# Patient Record
Sex: Female | Born: 1970 | Race: White | Hispanic: No | Marital: Married | Smoking: Never smoker
Health system: Southern US, Community
[De-identification: ages and names within clinical notes are randomized; demographics above are authoritative.]

---

## 2010-07-14 ENCOUNTER — Encounter
Admission: RE | Admit: 2010-07-14 | Discharge: 2010-07-14 | Payer: Self-pay | Source: Home / Self Care | Attending: Obstetrics and Gynecology | Admitting: Obstetrics and Gynecology

## 2016-02-14 ENCOUNTER — Encounter (HOSPITAL_COMMUNITY): Payer: Self-pay

## 2016-02-14 ENCOUNTER — Emergency Department (HOSPITAL_COMMUNITY): Payer: BLUE CROSS/BLUE SHIELD

## 2016-02-14 ENCOUNTER — Emergency Department (HOSPITAL_COMMUNITY)
Admission: EM | Admit: 2016-02-14 | Discharge: 2016-02-14 | Disposition: A | Discharge status: Home / Self Care | Payer: BLUE CROSS/BLUE SHIELD | Attending: Emergency Medicine | Admitting: Emergency Medicine

## 2016-02-14 DIAGNOSIS — R1032 Left lower quadrant pain: Secondary | ICD-10-CM | POA: Diagnosis present

## 2016-02-14 LAB — COMPREHENSIVE METABOLIC PANEL
ALBUMIN: 4.1 g/dL (ref 3.5–5.0)
ALT: 13 U/L — ABNORMAL LOW (ref 14–54)
ANION GAP: 10 (ref 5–15)
AST: 16 U/L (ref 15–41)
Alkaline Phosphatase: 32 U/L — ABNORMAL LOW (ref 38–126)
BILIRUBIN TOTAL: 0.6 mg/dL (ref 0.3–1.2)
BUN: 21 mg/dL — ABNORMAL HIGH (ref 6–20)
CHLORIDE: 103 mmol/L (ref 101–111)
CO2: 24 mmol/L (ref 22–32)
Calcium: 9.3 mg/dL (ref 8.9–10.3)
Creatinine, Ser: 0.64 mg/dL (ref 0.44–1.00)
GFR calc Af Amer: >60 mL/min (ref >60)
GFR calc non Af Amer: >60 mL/min (ref >60)
GLUCOSE: 122 mg/dL — AB (ref 65–99)
POTASSIUM: 4 mmol/L (ref 3.5–5.1)
SODIUM: 137 mmol/L (ref 135–145)
TOTAL PROTEIN: 6.5 g/dL (ref 6.5–8.1)

## 2016-02-14 LAB — CBC WITH DIFFERENTIAL/PLATELET
BASOS PCT: 0 %
Basophils Absolute: 0 10*3/uL (ref 0.0–0.1)
EOS ABS: 0.3 10*3/uL (ref 0.0–0.7)
EOS PCT: 3 %
HCT: 39.8 % (ref 36.0–46.0)
Hemoglobin: 12.7 g/dL (ref 12.0–15.0)
Lymphocytes Relative: 54 %
Lymphs Abs: 4.2 10*3/uL — ABNORMAL HIGH (ref 0.7–4.0)
MCH: 29.5 pg (ref 26.0–34.0)
MCHC: 31.9 g/dL (ref 30.0–36.0)
MCV: 92.3 fL (ref 78.0–100.0)
MONO ABS: 0.5 10*3/uL (ref 0.1–1.0)
MONOS PCT: 6 %
Neutro Abs: 2.9 10*3/uL (ref 1.7–7.7)
Neutrophils Relative %: 37 %
PLATELETS: 292 10*3/uL (ref 150–400)
RBC: 4.31 MIL/uL (ref 3.87–5.11)
RDW: 13 % (ref 11.5–15.5)
WBC: 7.9 10*3/uL (ref 4.0–10.5)

## 2016-02-14 LAB — I-STAT BETA HCG BLOOD, ED (MC, WL, AP ONLY): I-stat hCG, quantitative: <5 m[IU]/mL (ref <5)

## 2016-02-14 LAB — LIPASE, BLOOD: Lipase: 28 U/L (ref 11–51)

## 2016-02-14 MED ORDER — SODIUM CHLORIDE 0.9 % IV BOLUS (SEPSIS)
1000.0000 mL | Freq: Once | INTRAVENOUS | Status: AC
  Administered 2016-02-14: 1000 mL via INTRAVENOUS

## 2016-02-14 MED ORDER — DIATRIZOATE MEGLUMINE & SODIUM 66-10 % PO SOLN
ORAL | Status: AC
  Filled 2016-02-14: qty 30

## 2016-02-14 MED ORDER — ONDANSETRON 4 MG PO TBDP: 4.0000 mg | ORAL_TABLET | Freq: Three times a day (TID) | ORAL | 1 refills | Status: AC | PRN

## 2016-02-14 MED ORDER — MORPHINE SULFATE (PF) 4 MG/ML IV SOLN
4.0000 mg | Freq: Once | INTRAVENOUS | Status: AC
  Administered 2016-02-14: 4 mg via INTRAVENOUS
  Filled 2016-02-14: qty 1

## 2016-02-14 MED ORDER — SODIUM CHLORIDE 0.9 % IV SOLN
INTRAVENOUS | Status: DC
  Administered 2016-02-14: 09:00:00 via INTRAVENOUS

## 2016-02-14 MED ORDER — ONDANSETRON HCL 4 MG/2ML IJ SOLN
4.0000 mg | Freq: Once | INTRAMUSCULAR | Status: AC
  Administered 2016-02-14: 4 mg via INTRAVENOUS
  Filled 2016-02-14: qty 2

## 2016-02-14 MED ORDER — IOPAMIDOL (ISOVUE-300) INJECTION 61%
INTRAVENOUS | Status: AC
  Administered 2016-02-14: 100 mL
  Filled 2016-02-14: qty 100

## 2016-02-14 MED ORDER — HYDROCODONE-ACETAMINOPHEN 5-325 MG PO TABS
1.0000 | ORAL_TABLET | Freq: Four times a day (QID) | ORAL | 0 refills | Status: AC | PRN
Start: 2016-02-14 — End: ?

## 2016-02-14 NOTE — Discharge Instructions (Signed)
As we discussed suspect that there may have been a passed kidney stone. Due to the severity of the pain that is now resolved. If the pain reoccurs return for evaluation for possible recurrent kidney stone. Also if there is any persistent symptoms in the pelvic area would recommend follow-up with your OB/GYN. 2 prescriptions provided hydrocodone as needed for any recurrent pain and Zofran as needed for any recurrent nausea or vomiting.

## 2016-02-14 NOTE — ED Notes (Signed)
TJ (husband) [phone]

## 2016-02-14 NOTE — ED Provider Notes (Signed)
[facility]-EMERGENCY DEPT Provider Note   CSN: [id number] Arrival date & time: 02/14/16  4098  First Provider Contact:  First MD Initiated Contact with Patient 02/14/16 [phone]        History   Chief Complaint Chief Complaint  Patient presents with  . Abdominal Pain    HPI Rose Hampton is a 45 y.o. female.  Patient with acute onset of left lower quadrant abdominal pain at about 5:30 this morning that quickly spread to the right lower quadrant. Associated with nausea and vomiting. No dysuria no fevers no diarrhea no history of any similar pain in the past. Pain was 8 out of 10. Pain did radiate to the back. Not associated with dysuria, no vaginal bleeding.      History reviewed. No pertinent past medical history.  There are no active problems to display for this patient.   History reviewed. No pertinent surgical history.  OB History    No data available       Home Medications    Prior to Admission medications   Medication Sig Start Date End Date Taking? Authorizing Provider  cholecalciferol (VITAMIN D) 1000 units tablet Take 4,000 Units by mouth daily.   Yes Historical Provider, MD  magnesium oxide (MAG-OX) 400 (241.3 Mg) MG tablet Take 400 mg by mouth daily.   Yes Historical Provider, MD  omega-3 acid ethyl esters (LOVAZA) 1 g capsule Take 1 g by mouth daily.   Yes Historical Provider, MD  Probiotic Product (PROBIOTIC PO) Take 1 tablet by mouth daily.   Yes Historical Provider, MD  vitamin C (ASCORBIC ACID) 500 MG tablet Take 250-500 mg by mouth daily.   Yes Historical Provider, MD  Zinc 30 MG CAPS Take 30 mg by mouth daily.   Yes Historical Provider, MD  HYDROcodone-acetaminophen (NORCO/VICODIN) 5-325 MG tablet Take 1-2 tablets by mouth every 6 (six) hours as needed for moderate pain. 02/14/16   Vanetta Mulders, MD  ondansetron (ZOFRAN ODT) 4 MG disintegrating tablet Take 1 tablet (4 mg total) by mouth every 8 (eight) hours as needed for nausea or vomiting. 02/14/16   Vanetta Mulders, MD    Family History No family history on file.  Social History Social History  Substance Use Topics  . Smoking status: Never Smoker  . Smokeless tobacco: Not on file  . Alcohol use Yes     Allergies   Sulfa antibiotics   Review of Systems Review of Systems  Constitutional: Negative for fever.  HENT: Negative for congestion.   Eyes: Negative for redness.  Respiratory: Negative for shortness of breath.   Cardiovascular: Negative for chest pain.  Gastrointestinal: Positive for abdominal pain, nausea and vomiting.  Genitourinary: Negative for dysuria, hematuria and vaginal bleeding.  Musculoskeletal: Positive for back pain.  Skin: Negative for rash.  Neurological: Negative for headaches.  Hematological: Does not bruise/bleed easily.  Psychiatric/Behavioral: Negative for confusion.     Physical Exam Updated Vital Signs BP 108/69 (BP Location: Left Arm)   Pulse 64   Temp 97.7 F (36.5 C) (Oral)   Resp 18   LMP 02/03/2016 (Approximate)   SpO2 100%   Physical Exam  Constitutional: She is oriented to person, place, and time. She appears well-developed and well-nourished. No distress.  HENT:  Head: Normocephalic and atraumatic.  Eyes: EOM are normal. Pupils are equal, round, and reactive to light.  Neck: Normal range of motion. Neck supple.  Cardiovascular: Normal rate, regular rhythm and normal heart sounds.   Pulmonary/Chest: Effort normal and breath sounds  normal.  Abdominal: Soft. Bowel sounds are normal. She exhibits no distension. There is no tenderness.  Neurological: She is alert and oriented to person, place, and time. No cranial nerve deficit. She exhibits normal muscle tone. Coordination normal.  Skin: Skin is warm and dry.  Nursing note and vitals reviewed.    ED Treatments / Results  Labs (all labs ordered are listed, but only abnormal results are displayed) Labs Reviewed  CBC WITH DIFFERENTIAL/PLATELET - Abnormal; Notable for the  following:       Result Value   Lymphs Abs 4.2 (*)    All other components within normal limits  COMPREHENSIVE METABOLIC PANEL - Abnormal; Notable for the following:    Glucose, Bld 122 (*)    BUN 21 (*)    ALT 13 (*)    Alkaline Phosphatase 32 (*)    All other components within normal limits  LIPASE, BLOOD  I-STAT BETA HCG BLOOD, ED ([facility], [facility], [facility] ONLY)   Results for orders placed or performed during the hospital encounter of 02/14/16  CBC with Differential/Platelet  Result Value Ref Range   WBC 7.9 4.0 - 10.5 K/uL   RBC 4.31 3.87 - 5.11 MIL/uL   Hemoglobin 12.7 12.0 - 15.0 g/dL   HCT 96.2 22.9 - 79.8 %   MCV 92.3 78.0 - 100.0 fL   MCH 29.5 26.0 - 34.0 pg   MCHC 31.9 30.0 - 36.0 g/dL   RDW 92.1 19.4 - 17.4 %   Platelets 292 150 - 400 K/uL   Neutrophils Relative % 37 %   Neutro Abs 2.9 1.7 - 7.7 K/uL   Lymphocytes Relative 54 %   Lymphs Abs 4.2 (H) 0.7 - 4.0 K/uL   Monocytes Relative 6 %   Monocytes Absolute 0.5 0.1 - 1.0 K/uL   Eosinophils Relative 3 %   Eosinophils Absolute 0.3 0.0 - 0.7 K/uL   Basophils Relative 0 %   Basophils Absolute 0.0 0.0 - 0.1 K/uL  Comprehensive metabolic panel  Result Value Ref Range   Sodium 137 135 - 145 mmol/L   Potassium 4.0 3.5 - 5.1 mmol/L   Chloride 103 101 - 111 mmol/L   CO2 24 22 - 32 mmol/L   Glucose, Bld 122 (H) 65 - 99 mg/dL   BUN 21 (H) 6 - 20 mg/dL   Creatinine, Ser 0.81 0.44 - 1.00 mg/dL   Calcium 9.3 8.9 - 44.8 mg/dL   Total Protein 6.5 6.5 - 8.1 g/dL   Albumin 4.1 3.5 - 5.0 g/dL   AST 16 15 - 41 U/L   ALT 13 (L) 14 - 54 U/L   Alkaline Phosphatase 32 (L) 38 - 126 U/L   Total Bilirubin 0.6 0.3 - 1.2 mg/dL   GFR calc non Af Amer >60 >60 mL/min   GFR calc Af Amer >60 >60 mL/min   Anion gap 10 5 - 15  Lipase, blood  Result Value Ref Range   Lipase 28 11 - 51 U/L  I-Stat Beta hCG blood, ED ([facility], [facility], [facility] only)  Result Value Ref Range   I-stat hCG, quantitative <5.0 <5 mIU/mL   Comment 3             EKG  EKG  Interpretation None       Radiology Ct Abdomen Pelvis W Contrast  Result Date: 02/14/2016 CLINICAL DATA:  Left lower quadrant pain.  Vomiting. EXAM: CT ABDOMEN AND PELVIS WITH CONTRAST TECHNIQUE: Multidetector CT imaging of the abdomen and pelvis was performed using  the standard protocol following bolus administration of intravenous contrast. CONTRAST:  ISOVUE-300 IOPAMIDOL (ISOVUE-300) INJECTION 61% COMPARISON:  None. FINDINGS: Normal lung bases. No free air. A tiny amount of fluid is seen in the pelvis. No other free fluid. Periportal edema is identified diffusely. The liver is otherwise normal in appearance. The portal vein is normal. The gallbladder remains and is normal in appearance. The spleen, adrenal glands, and pancreas are within normal limits. The kidneys demonstrate no acute abnormalities. The right kidney is normal. There is a tiny 2 mm stone in the lower left kidney as seen on axial image 37. No hydronephrosis or perinephric stranding. No ureterectasis or ureteral stones. The abdominal aorta is normal in appearance. No adenopathy. The stomach and small bowel are normal. The appendix is best seen on the coronal images and normal in appearance with no evidence of appendicitis. The colon is normal in appearance. The uterus and ovaries are normal in appearance. No adenopathy or mass in the pelvis. The bladder is normal. Delayed images through the upper abdomen demonstrate no filling defects in the upper renal collecting systems. No acute bony abnormalities. IMPRESSION: 1. No cause for acute pain identified. 2. Periportal edema. This is a nonspecific finding and is of uncertain etiology or acute significance. 3. The fluid in the pelvis is likely physiologic. 4. 2 mm nonobstructive stone in the lower left kidney. Electronically Signed   By: Gerome Sam III M.D   On: 02/14/2016 11:55    Procedures Procedures (including critical care time)  Medications Ordered in ED Medications  0.9 %   sodium chloride infusion ( Intravenous New Bag/Given 02/14/16 0910)  diatrizoate meglumine-sodium (GASTROGRAFIN) 66-10 % solution (not administered)  morphine 4 MG/ML injection 4 mg (4 mg Intravenous Given 02/14/16 0644)  ondansetron (ZOFRAN) injection 4 mg (4 mg Intravenous Given 02/14/16 0644)  sodium chloride 0.9 % bolus 1,000 mL (0 mLs Intravenous Stopped 02/14/16 0813)  iopamidol (ISOVUE-300) 61 % injection (100 mLs  Contrast Given 02/14/16 1056)     Initial Impression / Assessment and Plan / ED Course  I have reviewed the triage vital signs and the nursing notes.  Pertinent labs & imaging results that were available during my care of the patient were reviewed by me and considered in my medical decision making (see chart for details).  Clinical Course    Patient with extensive workup for acute onset of left lower quadrant abdominal pain that radiated to the right lower quadrant. CT scan shows evidence of a nonobstructing left-sided kidney stone but nothing in the ureter. Patient's pain went away somewhat spontaneously seemed to resolve with morphine but is remained away so I suspect that had resolved itself. CT scan otherwise without any acute findings. Patient's now asymptomatic. Suspect that she may have passed a kidney stone. That is not evident by CT scan. Patient without any renal function abnormalities no leukocytosis no anemia.  Patient will return for any recurrent similar symptoms that are severe. And will need further investigation of possible recurrent kidney stone.   If patient continues with any kind of persistent pelvic complaint without severe pain would recommend follow-up with her OB/GYN for further evaluation.  Final Clinical Impressions(s) / ED Diagnoses   Final diagnoses:  Left lower quadrant pain    New Prescriptions New Prescriptions   HYDROCODONE-ACETAMINOPHEN (NORCO/VICODIN) 5-325 MG TABLET    Take 1-2 tablets by mouth every 6 (six) hours as needed for moderate pain.    ONDANSETRON (ZOFRAN ODT) 4 MG DISINTEGRATING TABLET  Take 1 tablet (4 mg total) by mouth every 8 (eight) hours as needed for nausea or vomiting.     Vanetta Mulders, MD 02/14/16 1235

## 2016-02-14 NOTE — ED Triage Notes (Signed)
Pt c/o of stomach ache that started at 0530. Pain progressed quickly. Lower abdomen pain that radiates to hips. Episode of vomiting, denies diarrhea. Denies any urinary sx.

## 2018-12-26 DIAGNOSIS — Z6821 Body mass index (BMI) 21.0-21.9, adult: Secondary | ICD-10-CM | POA: Diagnosis not present

## 2018-12-26 DIAGNOSIS — Z01419 Encounter for gynecological examination (general) (routine) without abnormal findings: Secondary | ICD-10-CM | POA: Diagnosis not present

## 2019-03-15 DIAGNOSIS — Z86010 Personal history of colonic polyps: Secondary | ICD-10-CM | POA: Diagnosis not present

## 2019-03-15 DIAGNOSIS — Z1211 Encounter for screening for malignant neoplasm of colon: Secondary | ICD-10-CM | POA: Diagnosis not present

## 2019-04-27 DIAGNOSIS — Z86010 Personal history of colonic polyps: Secondary | ICD-10-CM | POA: Diagnosis not present

## 2019-04-27 DIAGNOSIS — Z1211 Encounter for screening for malignant neoplasm of colon: Secondary | ICD-10-CM | POA: Diagnosis not present

## 2019-09-21 ENCOUNTER — Other Ambulatory Visit: Payer: Self-pay | Admitting: Obstetrics and Gynecology

## 2019-09-21 DIAGNOSIS — Z1231 Encounter for screening mammogram for malignant neoplasm of breast: Secondary | ICD-10-CM

## 2019-10-12 ENCOUNTER — Ambulatory Visit: Payer: BLUE CROSS/BLUE SHIELD

## 2019-10-12 ENCOUNTER — Ambulatory Visit
Admission: RE | Admit: 2019-10-12 | Discharge: 2019-10-12 | Disposition: A | Discharge status: Home / Self Care | Payer: BC Managed Care – PPO | Source: Ambulatory Visit | Attending: Obstetrics and Gynecology | Admitting: Obstetrics and Gynecology

## 2019-10-12 ENCOUNTER — Other Ambulatory Visit: Payer: Self-pay

## 2019-10-12 DIAGNOSIS — Z1231 Encounter for screening mammogram for malignant neoplasm of breast: Secondary | ICD-10-CM

## 2019-10-29 DIAGNOSIS — Z03818 Encounter for observation for suspected exposure to other biological agents ruled out: Secondary | ICD-10-CM | POA: Diagnosis not present

## 2019-10-29 DIAGNOSIS — Z20828 Contact with and (suspected) exposure to other viral communicable diseases: Secondary | ICD-10-CM | POA: Diagnosis not present

## 2020-01-03 DIAGNOSIS — Z0189 Encounter for other specified special examinations: Secondary | ICD-10-CM | POA: Diagnosis not present

## 2020-01-03 DIAGNOSIS — Z01419 Encounter for gynecological examination (general) (routine) without abnormal findings: Secondary | ICD-10-CM | POA: Diagnosis not present

## 2020-01-03 DIAGNOSIS — Z6820 Body mass index (BMI) 20.0-20.9, adult: Secondary | ICD-10-CM | POA: Diagnosis not present

## 2020-01-03 DIAGNOSIS — Z124 Encounter for screening for malignant neoplasm of cervix: Secondary | ICD-10-CM | POA: Diagnosis not present

## 2020-03-05 DIAGNOSIS — U071 COVID-19: Secondary | ICD-10-CM | POA: Diagnosis not present

## 2020-04-07 DIAGNOSIS — H524 Presbyopia: Secondary | ICD-10-CM | POA: Diagnosis not present

## 2020-04-07 DIAGNOSIS — H5203 Hypermetropia, bilateral: Secondary | ICD-10-CM | POA: Diagnosis not present

## 2020-04-07 DIAGNOSIS — H53022 Refractive amblyopia, left eye: Secondary | ICD-10-CM | POA: Diagnosis not present

## 2020-04-07 DIAGNOSIS — H52223 Regular astigmatism, bilateral: Secondary | ICD-10-CM | POA: Diagnosis not present

## 2020-05-19 DIAGNOSIS — R0602 Shortness of breath: Secondary | ICD-10-CM | POA: Diagnosis not present

## 2020-05-19 DIAGNOSIS — U099 Post covid-19 condition, unspecified: Secondary | ICD-10-CM | POA: Diagnosis not present

## 2020-05-20 ENCOUNTER — Ambulatory Visit
Admission: RE | Admit: 2020-05-20 | Discharge: 2020-05-20 | Disposition: A | Discharge status: Home / Self Care | Payer: BC Managed Care – PPO | Source: Ambulatory Visit | Attending: Internal Medicine | Admitting: Internal Medicine

## 2020-05-20 ENCOUNTER — Other Ambulatory Visit: Payer: Self-pay | Admitting: Internal Medicine

## 2020-05-20 DIAGNOSIS — R0602 Shortness of breath: Secondary | ICD-10-CM | POA: Diagnosis not present

## 2020-05-27 DIAGNOSIS — U099 Post covid-19 condition, unspecified: Secondary | ICD-10-CM | POA: Diagnosis not present

## 2020-05-27 DIAGNOSIS — G933 Postviral fatigue syndrome: Secondary | ICD-10-CM | POA: Diagnosis not present

## 2020-06-27 DIAGNOSIS — L658 Other specified nonscarring hair loss: Secondary | ICD-10-CM | POA: Diagnosis not present

## 2020-06-27 DIAGNOSIS — L219 Seborrheic dermatitis, unspecified: Secondary | ICD-10-CM | POA: Diagnosis not present

## 2020-06-27 DIAGNOSIS — L650 Telogen effluvium: Secondary | ICD-10-CM | POA: Diagnosis not present

## 2020-10-22 DIAGNOSIS — Z0000 Encounter for general adult medical examination without abnormal findings: Secondary | ICD-10-CM | POA: Diagnosis not present

## 2020-10-22 DIAGNOSIS — K644 Residual hemorrhoidal skin tags: Secondary | ICD-10-CM | POA: Diagnosis not present

## 2020-10-22 DIAGNOSIS — Z13220 Encounter for screening for lipoid disorders: Secondary | ICD-10-CM | POA: Diagnosis not present

## 2020-10-22 DIAGNOSIS — Z130 Encounter for screening for diseases of the blood and blood-forming organs and certain disorders involving the immune mechanism: Secondary | ICD-10-CM | POA: Diagnosis not present

## 2020-11-05 DIAGNOSIS — K644 Residual hemorrhoidal skin tags: Secondary | ICD-10-CM | POA: Diagnosis not present

## 2020-12-11 DIAGNOSIS — K644 Residual hemorrhoidal skin tags: Secondary | ICD-10-CM | POA: Diagnosis not present

## 2021-01-15 DIAGNOSIS — K644 Residual hemorrhoidal skin tags: Secondary | ICD-10-CM | POA: Diagnosis not present

## 2021-12-06 IMAGING — MG DIGITAL SCREENING BILAT W/ CAD
4 series · 4 of 4 positions shown · non-contrast
Comparison: Previous exam(s).

CLINICAL DATA: Screening.

EXAM:
DIGITAL SCREENING BILATERAL MAMMOGRAM WITH CAD

[R CC]
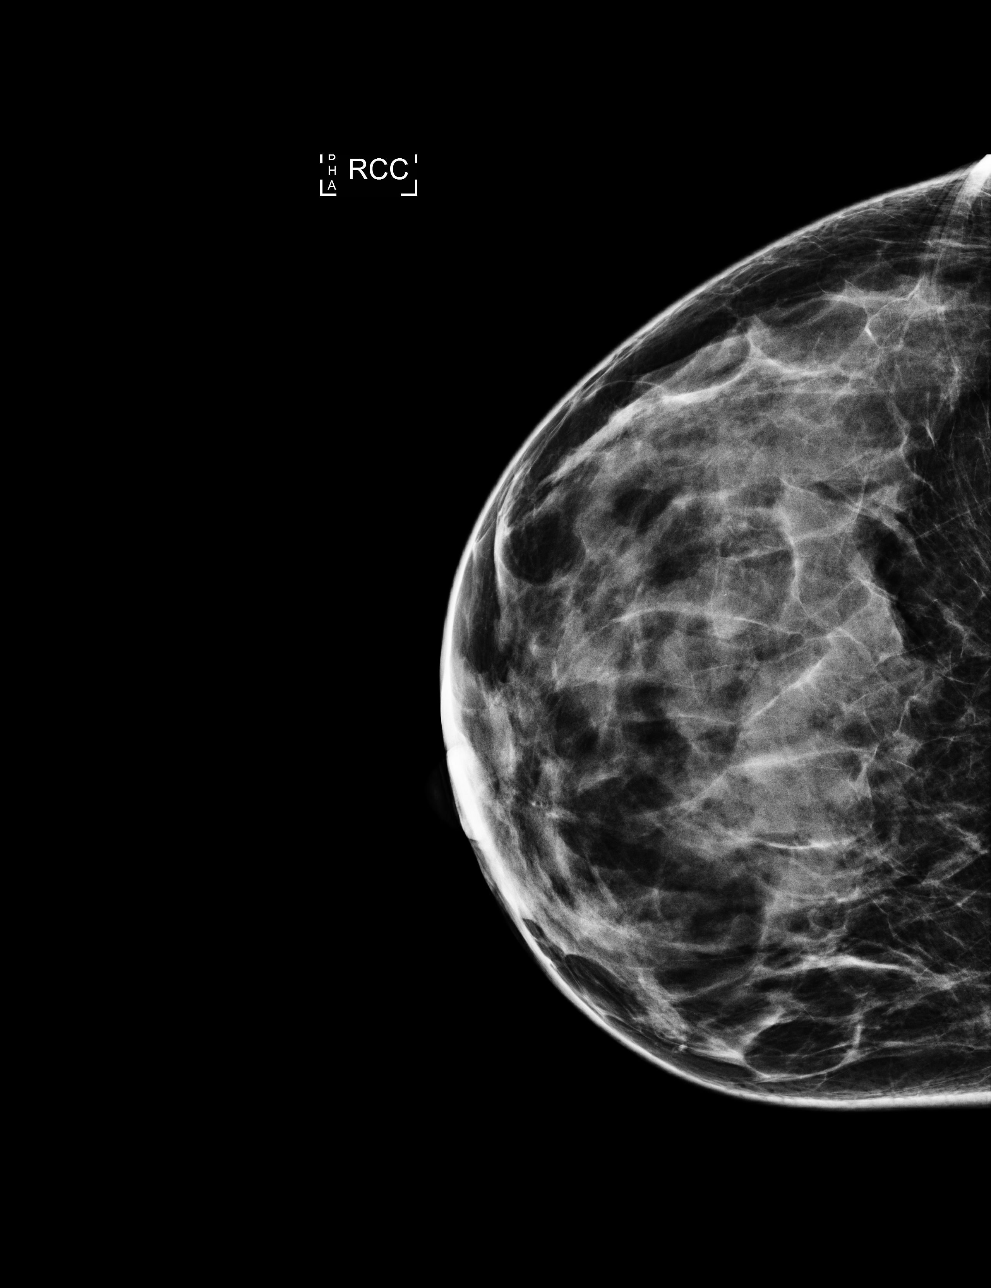

[L CC]
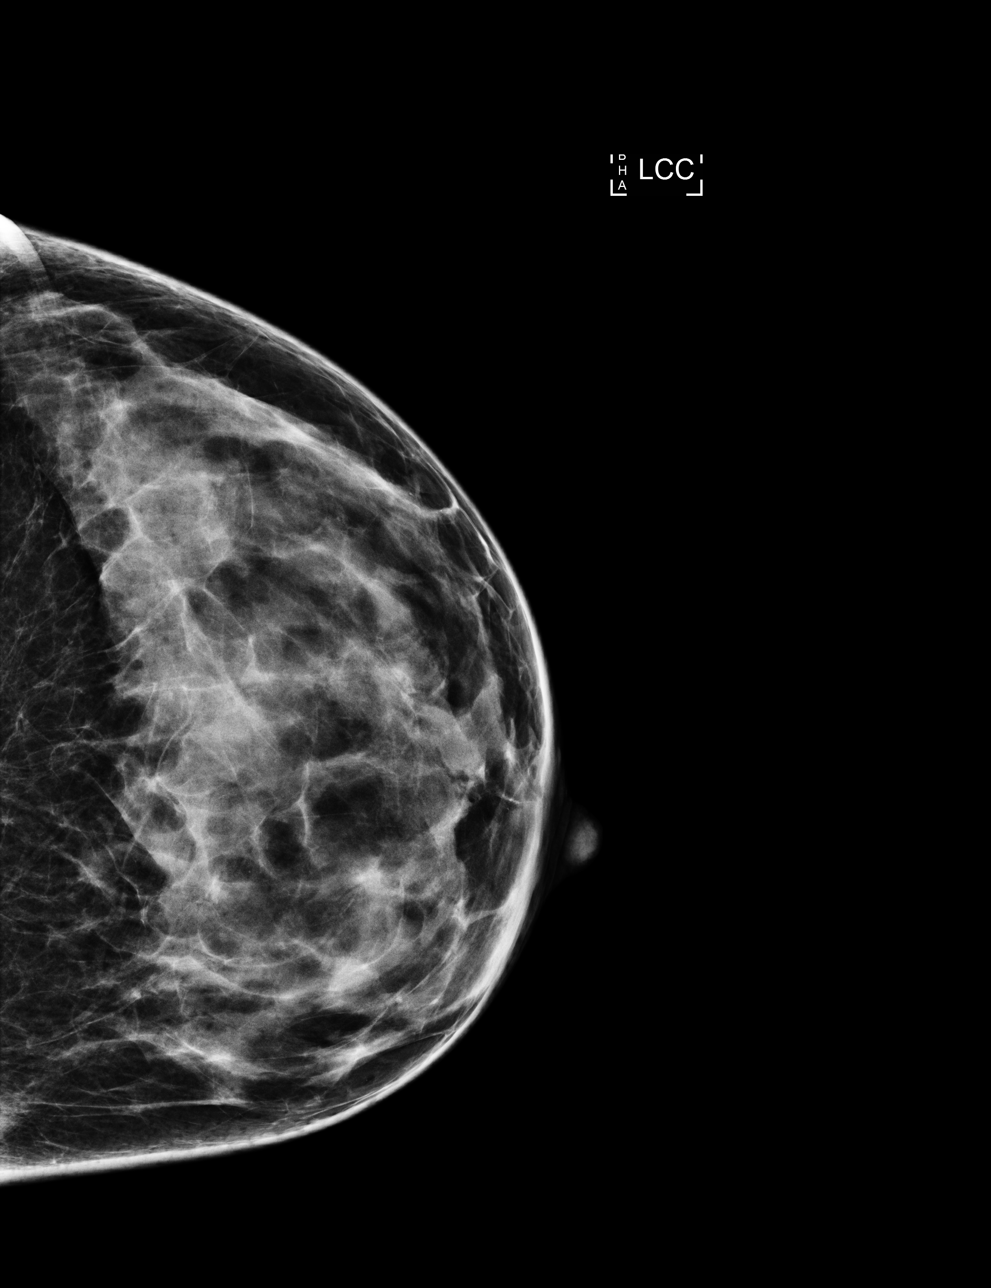

[L MLO]
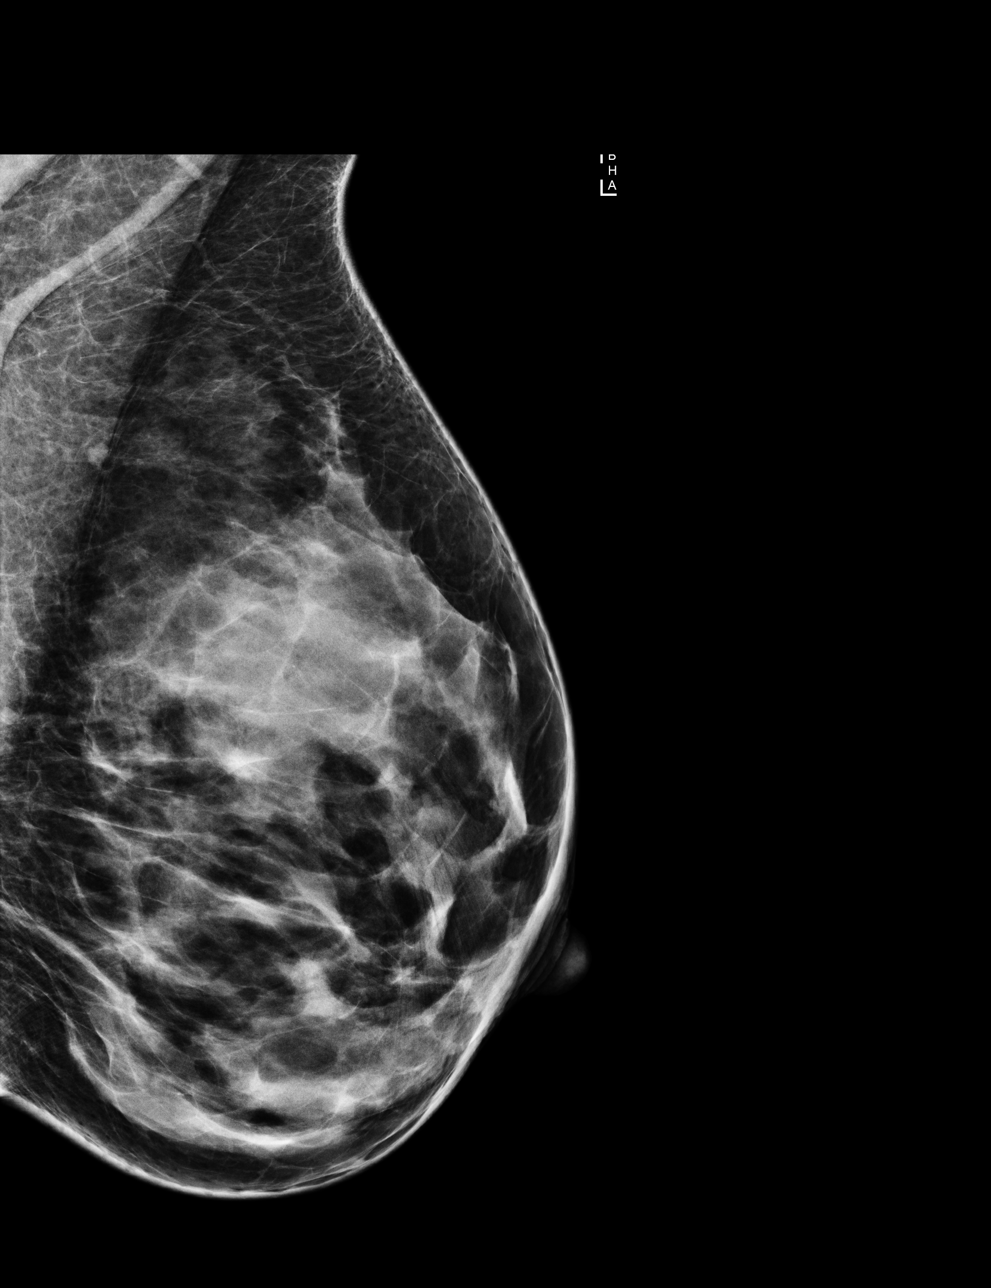

[R MLO]
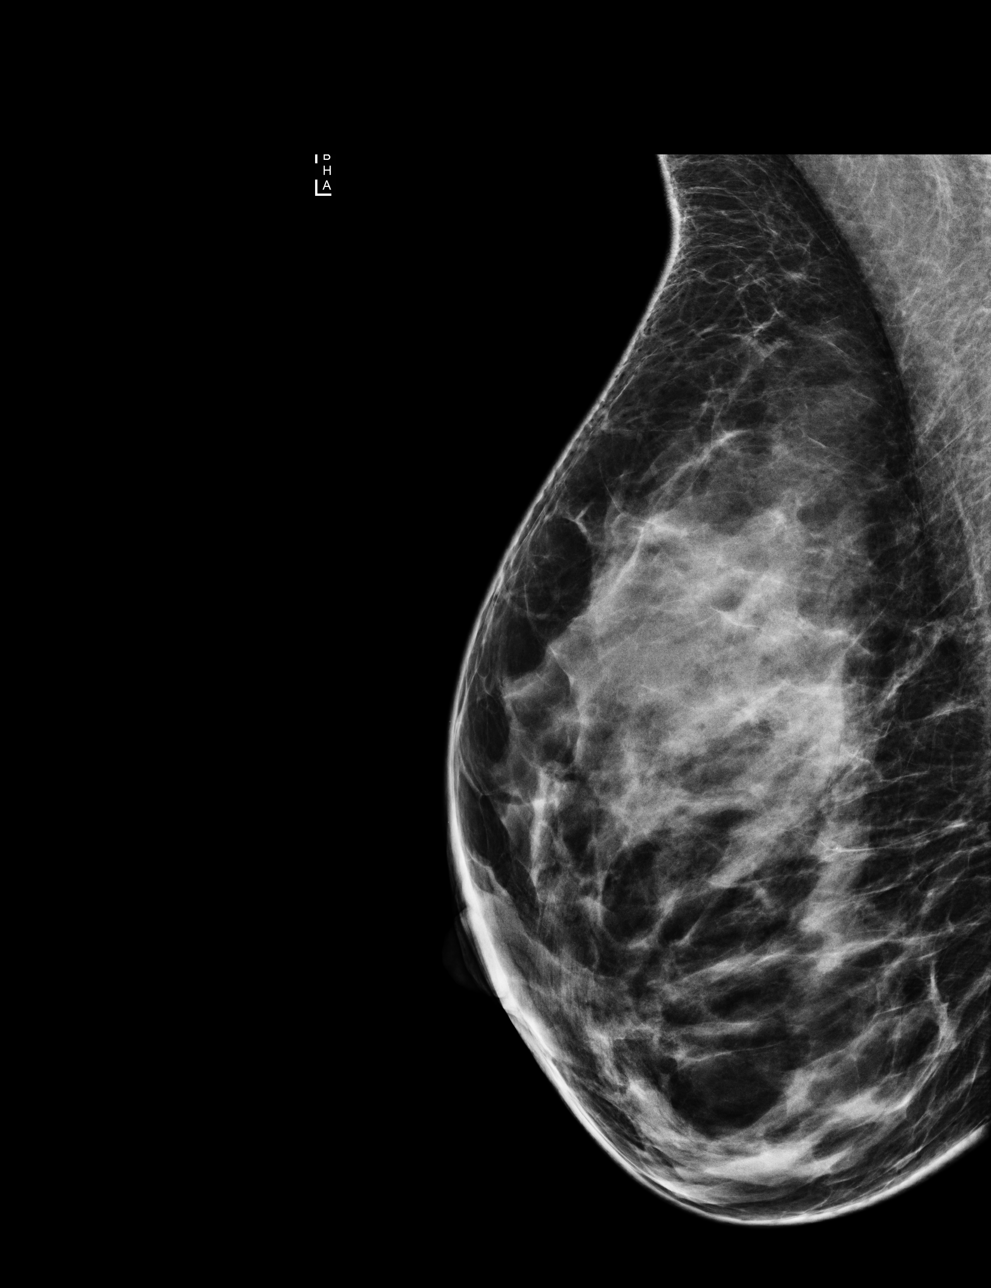

[4 of 4 positions shown; findings below may reference images not displayed]

ACR Breast Density Category c: The breast tissue is heterogeneously
dense, which may obscure small masses.
FINDINGS: There are no findings suspicious for malignancy. Images were
processed with CAD.
IMPRESSION: No mammographic evidence of malignancy. A result letter of this
screening mammogram will be mailed directly to the patient.

RECOMMENDATION:
Screening mammogram in one year. (Code:[id number])

BI-RADS CATEGORY  1: Negative.

## 2022-07-09 ENCOUNTER — Emergency Department (HOSPITAL_COMMUNITY)
Admission: EM | Admit: 2022-07-09 | Discharge: 2022-07-10 | Disposition: A | Discharge status: Home / Self Care | Payer: BC Managed Care – PPO | Attending: Student | Admitting: Student

## 2022-07-09 ENCOUNTER — Encounter (HOSPITAL_COMMUNITY): Payer: Self-pay | Admitting: *Deleted

## 2022-07-09 ENCOUNTER — Other Ambulatory Visit: Payer: Self-pay

## 2022-07-09 DIAGNOSIS — N939 Abnormal uterine and vaginal bleeding, unspecified: Secondary | ICD-10-CM

## 2022-07-09 DIAGNOSIS — R102 Pelvic and perineal pain: Secondary | ICD-10-CM | POA: Insufficient documentation

## 2022-07-09 LAB — BASIC METABOLIC PANEL
Anion gap: 4 — ABNORMAL LOW (ref 5–15)
BUN: 6 mg/dL (ref 6–20)
CO2: 26 mmol/L (ref 22–32)
Calcium: 9.2 mg/dL (ref 8.9–10.3)
Chloride: 102 mmol/L (ref 98–111)
Creatinine, Ser: 0.53 mg/dL (ref 0.44–1.00)
GFR, Estimated: >60 mL/min (ref >60)
Glucose, Bld: 105 mg/dL — ABNORMAL HIGH (ref 70–99)
Potassium: 3.7 mmol/L (ref 3.5–5.1)
Sodium: 132 mmol/L — ABNORMAL LOW (ref 135–145)

## 2022-07-09 LAB — CBC
HCT: 36.8 % (ref 36.0–46.0)
Hemoglobin: 12.1 g/dL (ref 12.0–15.0)
MCH: 29.8 pg (ref 26.0–34.0)
MCHC: 32.9 g/dL (ref 30.0–36.0)
MCV: 90.6 fL (ref 80.0–100.0)
Platelets: 279 10*3/uL (ref 150–400)
RBC: 4.06 MIL/uL (ref 3.87–5.11)
RDW: 12 % (ref 11.5–15.5)
WBC: 6 10*3/uL (ref 4.0–10.5)
nRBC: 0 % (ref 0.0–0.2)

## 2022-07-09 LAB — I-STAT BETA HCG BLOOD, ED (MC, WL, AP ONLY): I-stat hCG, quantitative: <5 m[IU]/mL (ref <5)

## 2022-07-09 NOTE — ED Provider Triage Note (Signed)
Emergency Medicine Provider Triage Evaluation Note  Rose Hampton , a 51 y.o. female  was evaluated in triage.  Pt complains of vaginal bleeding. She states that on November 16th she had her normal period that is typically very heavy for 1 day and then led by a few days of spotting and then subsides.  States that this time she continued to spot persistently until today when she had a large rush of blood that is continued throughout the day.  States that she is going through tampons every few minutes.  Denies any abdominal pain.  Denies any history of similar previously.  States she has been told she is perimenopausal.   Review of Systems  Positive:  Negative:   Physical Exam  BP (!) 159/93   Pulse 92   Temp 99.1 F (37.3 C) (Oral)   Resp 18   Ht 5\' 3"  (1.6 m)   Wt 54.4 kg   LMP 05/27/2022   SpO2 99%   BMI 21.26 kg/m  Gen:   Awake, no distress   Resp:  Normal effort  MSK:   Moves extremities without difficulty  Other:    Medical Decision Making  Medically screening exam initiated at 7:55 PM.  Appropriate orders placed.  Joe Tanney was informed that the remainder of the evaluation will be completed by another provider, this initial triage assessment does not replace that evaluation, and the importance of remaining in the ED until their evaluation is complete.     Margret Chance, PA-C 07/09/22 [phone]

## 2022-07-09 NOTE — ED Triage Notes (Signed)
The pt started her period on the 16th since then ahs has had heavy vaginal bleeding with clots worse today  lmp November 16th

## 2022-07-10 ENCOUNTER — Emergency Department (HOSPITAL_COMMUNITY): Payer: BC Managed Care – PPO

## 2022-07-10 LAB — HEMOGLOBIN AND HEMATOCRIT, BLOOD
HCT: 36.7 % (ref 36.0–46.0)
Hemoglobin: 11.9 g/dL — ABNORMAL LOW (ref 12.0–15.0)

## 2022-07-10 MED ORDER — MEDROXYPROGESTERONE ACETATE 5 MG PO TABS: 5.0000 mg | ORAL_TABLET | Freq: Every day | ORAL | 0 refills | Status: AC

## 2022-07-10 MED ORDER — SODIUM CHLORIDE 0.9 % IV BOLUS
1000.0000 mL | Freq: Once | INTRAVENOUS | Status: AC
  Administered 2022-07-10: 1000 mL via INTRAVENOUS

## 2022-07-10 MED ORDER — MEDROXYPROGESTERONE ACETATE 2.5 MG PO TABS
5.0000 mg | ORAL_TABLET | Freq: Every day | ORAL | Status: DC
  Administered 2022-07-10: 5 mg via ORAL
  Filled 2022-07-10: qty 2

## 2022-07-10 MED ORDER — NAPROXEN 250 MG PO TABS
500.0000 mg | ORAL_TABLET | Freq: Once | ORAL | Status: AC
  Administered 2022-07-10: 500 mg via ORAL
  Filled 2022-07-10: qty 2

## 2022-07-10 MED ORDER — MEDROXYPROGESTERONE ACETATE 2.5 MG PO TABS: 5.0000 mg | ORAL_TABLET | Freq: Every day | ORAL | Status: DC

## 2022-07-10 NOTE — ED Notes (Signed)
IV fluids complete sitting up on stretcher ginger ale and crackers given. States she is feeling better.

## 2022-07-10 NOTE — Discharge Instructions (Addendum)
FINDINGS:  Uterus    Measurements: 9.9 x 4.9 x 7.3 cm = volume: 186.8 mL. No fibroids or  other mass visualized. The cervix is not optimally visualized but it  appears to be closed.    Endometrium    Thickness: 15 mm, prominent for age and perimenopausal state. There  is a small amount of fluid or blood separating the endometrial  walls. No focal endometrial mass was seen.    Right ovary    Measurements: 3.6 x 2.1 x 1.5 cm = volume: 6.0 mL. Normal  appearance/no adnexal mass.    Left ovary    Measurements: 2.5 x 1.3 x 1.3 cm = volume: 2.1 mL. Normal  appearance/no adnexal mass.    Color doppler evaluation demonstrates normal appearing color flow.    Other: No free fluid is seen.  No adnexal mass is evident.    IMPRESSION:  1. The endometrium is prominent for age and perimenopausal state.  There is a small amount of fluid or blood separating the endometrial  walls.  2. The uterus is otherwise unremarkable.  3. The ovaries are normal in size. No adnexal mass, torsion or free  fluid is evident.  4. If bleeding remains unresponsive to hormonal or medical therapy,  sonohysterogram should be considered for focal lesion work-up. (Ref:  Radiological Reasoning: Algorithmic Workup of Abnormal Vaginal  Bleeding with Endovaginal Sonography and Sonohysterography. AJR  2008; 681:L57-26)  5. The cervix is not optimally visualized but appears to be closed.      Electronically Signed

## 2022-07-10 NOTE — ED Notes (Signed)
NT informed this RN that pt got nauseous and diaphoretic when getting up for d/c. Pt laid back in bed and VS obtained. Pt hypotensive at this time. EDP notified.

## 2022-07-10 NOTE — ED Notes (Signed)
States she is still feeling ok ,stilling on the side of the stretcher.

## 2022-07-10 NOTE — ED Provider Notes (Signed)
[facility] EMERGENCY DEPARTMENT Provider Note  CSN: [id number] Arrival date & time: 07/09/22 1932  Chief Complaint(s) Vaginal Bleeding  HPI Rose Hampton is a 51 y.o. female who presents emergency department for evaluation of vaginal bleeding.  Patient states that over the last 1 year she has had heavier periods but over the last 24 hours has had a significant increase in the amount of vaginal bleeding associated with her current period.  She states that she felt a large gush of blood and saw multiple blood clots.  She is using tampons and having to change them frequently.  She denies abdominal pain, nausea, vomiting, chest pain, shortness of breath or other systemic symptoms.  She is currently not on any birth control and denies blood thinner use.   Past Medical History History reviewed. No pertinent past medical history. There are no problems to display for this patient.  Home Medication(s) Prior to Admission medications   Medication Sig Start Date End Date Taking? Authorizing Provider  cholecalciferol (VITAMIN D) 1000 units tablet Take 4,000 Units by mouth daily.    [provider]  HYDROcodone-acetaminophen (NORCO/VICODIN) 5-325 MG tablet Take 1-2 tablets by mouth every 6 (six) hours as needed for moderate pain. 02/14/16   Vanetta Mulders, MD  magnesium oxide (MAG-OX) 400 (241.3 Mg) MG tablet Take 400 mg by mouth daily.    [provider]  omega-3 acid ethyl esters (LOVAZA) 1 g capsule Take 1 g by mouth daily.    [provider]  ondansetron (ZOFRAN ODT) 4 MG disintegrating tablet Take 1 tablet (4 mg total) by mouth every 8 (eight) hours as needed for nausea or vomiting. 02/14/16   Vanetta Mulders, MD  Probiotic Product (PROBIOTIC PO) Take 1 tablet by mouth daily.    [provider]  vitamin C (ASCORBIC ACID) 500 MG tablet Take 250-500 mg by mouth daily.    [provider]  Zinc 30 MG CAPS Take 30 mg by mouth daily.     [provider]                                                                                                                                    Past Surgical History History reviewed. No pertinent surgical history. Family History History reviewed. No pertinent family history.  Social History Social History   Tobacco Use   Smoking status: Never  Substance Use Topics   Alcohol use: Yes   Allergies Sulfa antibiotics  Review of Systems Review of Systems  Genitourinary:  Positive for vaginal bleeding.    Physical Exam Vital Signs  I have reviewed the triage vital signs BP (!) 152/86   Pulse 67   Temp 97.9 F (36.6 C)   Resp 16   Ht 5\' 3"  (1.6 m)   Wt 54.4 kg   LMP 05/27/2022   SpO2 100%   BMI 21.26 kg/m   Physical Exam Vitals and  nursing note reviewed.  Constitutional:      General: She is not in acute distress.    Appearance: She is well-developed.  HENT:     Head: Normocephalic and atraumatic.  Eyes:     Conjunctiva/sclera: Conjunctivae normal.  Cardiovascular:     Rate and Rhythm: Normal rate and regular rhythm.     Heart sounds: No murmur heard. Pulmonary:     Effort: Pulmonary effort is normal. No respiratory distress.     Breath sounds: Normal breath sounds.  Abdominal:     Palpations: Abdomen is soft.     Tenderness: There is no abdominal tenderness.  Musculoskeletal:        General: No swelling.     Cervical back: Neck supple.  Skin:    General: Skin is warm and dry.     Capillary Refill: Capillary refill takes less than 2 seconds.  Neurological:     Mental Status: She is alert.  Psychiatric:        Mood and Affect: Mood normal.     ED Results and Treatments Labs (all labs ordered are listed, but only abnormal results are displayed) Labs Reviewed  BASIC METABOLIC PANEL - Abnormal; Notable for the following components:      Result Value   Sodium 132 (*)    Glucose, Bld 105 (*)    Anion gap 4 (*)    All other components within  normal limits  CBC  I-STAT BETA HCG BLOOD, ED ([facility], [facility], [facility] ONLY)                                                                                                                          Radiology No results found.  Pertinent labs & imaging results that were available during my care of the patient were reviewed by me and considered in my medical decision making (see MDM for details).  Medications Ordered in ED Medications - No data to display                                                                                                                                   Procedures Procedures  (including critical care time)  Medical Decision Making / ED Course   This patient presents to the ED for concern of vaginal bleeding, this involves an extensive number of treatment options, and is a complaint that carries with it a high risk  of complications and morbidity.  The differential diagnosis includes abnormal uterine bleeding, endometrial hyperplasia, uterine fibroids, retained products, ectopic pregnancy, vaginal wall laceration  MDM: Patient seen in the emergency department for evaluation of vaginal bleeding.  External physical exam largely unremarkable.  We had a long discussion about performing a pelvic exam and as this would likely not change the management here in the emergency department a pelvic exam was deferred.  Laboratory evaluation unremarkable and a follow-up H&H remained stable at 11.9.  Pregnancy negative.  Pelvic ultrasound obtained showing a thickened endometrium approximately 15 mm with a small amount of blood or fluid separating the endometrial walls with normal ovaries.  We had a discussion about Megace use versus Provera and ultimately decided to trial Provera and attempt to decrease amount of bleeding she is having.  I informed the patient that she will need to reach out to her OB/GYN to discuss this endometrial thickening and they can risk stratify the need for biopsy or  not.  Right at time of discharge, patient quickly sat up and had an orthostatic/vagal event where she became pale and sweaty and hypertensive but was appropriately fluid responsive and ultimately symptoms did not return after fluid resuscitation.  Given normal follow-up H&H performed in the emergency department I am not concerned that the patient is having acute blood loss and hemorrhagic shock as a cause of this vagal event.  She was lying flat for an extended period of time and sat up quickly which is likely the source of this event.  Patient was given strict return precautions of which she voiced understanding, received her first dose of Provera here in the ER and Provera was sent to her pharmacy.  Patient then discharged with OB/GYN follow-up and return precautions   Additional history obtained: -Additional history obtained from husband -External records from outside source obtained and reviewed including: Chart review including previous notes, labs, imaging, consultation notes   Lab Tests: -I ordered, reviewed, and interpreted labs.   The pertinent results include:   Labs Reviewed  BASIC METABOLIC PANEL - Abnormal; Notable for the following components:      Result Value   Sodium 132 (*)    Glucose, Bld 105 (*)    Anion gap 4 (*)    All other components within normal limits  CBC  I-STAT BETA HCG BLOOD, ED ([facility], [facility], [facility] ONLY)      Imaging Studies ordered: I ordered imaging studies including ultrasound pelvis I independently visualized and interpreted imaging. I agree with the radiologist interpretation   Medicines ordered and prescription drug management: No orders of the defined types were placed in this encounter.   -I have reviewed the patients home medicines and have made adjustments as needed  Critical interventions none   Cardiac Monitoring: The patient was maintained on a cardiac monitor.  I personally viewed and interpreted the cardiac monitored which showed an  underlying rhythm of: NSR  Social Determinants of Health:  Factors impacting patients care include: none   Reevaluation: After the interventions noted above, I reevaluated the patient and found that they have :stayed the same  Co morbidities that complicate the patient evaluation History reviewed. No pertinent past medical history.    Dispostion: I considered admission for this patient, but she currently does not meet inpatient criteria for admission she is safe for discharge with outpatient OB/GYN follow-up     Final Clinical Impression(s) / ED Diagnoses Final diagnoses:  None     @PCDICTATION @  Glendora Score, MD 07/10/22 [phone]

## 2022-07-15 IMAGING — DX DG CHEST 2V
2 series · 2 of 2 positions shown · non-contrast
Comparison: None.

CLINICAL DATA: Shortness of breath, history of [id number] positivity

EXAM:
CHEST - 2 VIEW

[dg chest 2 view (1 of 2)]
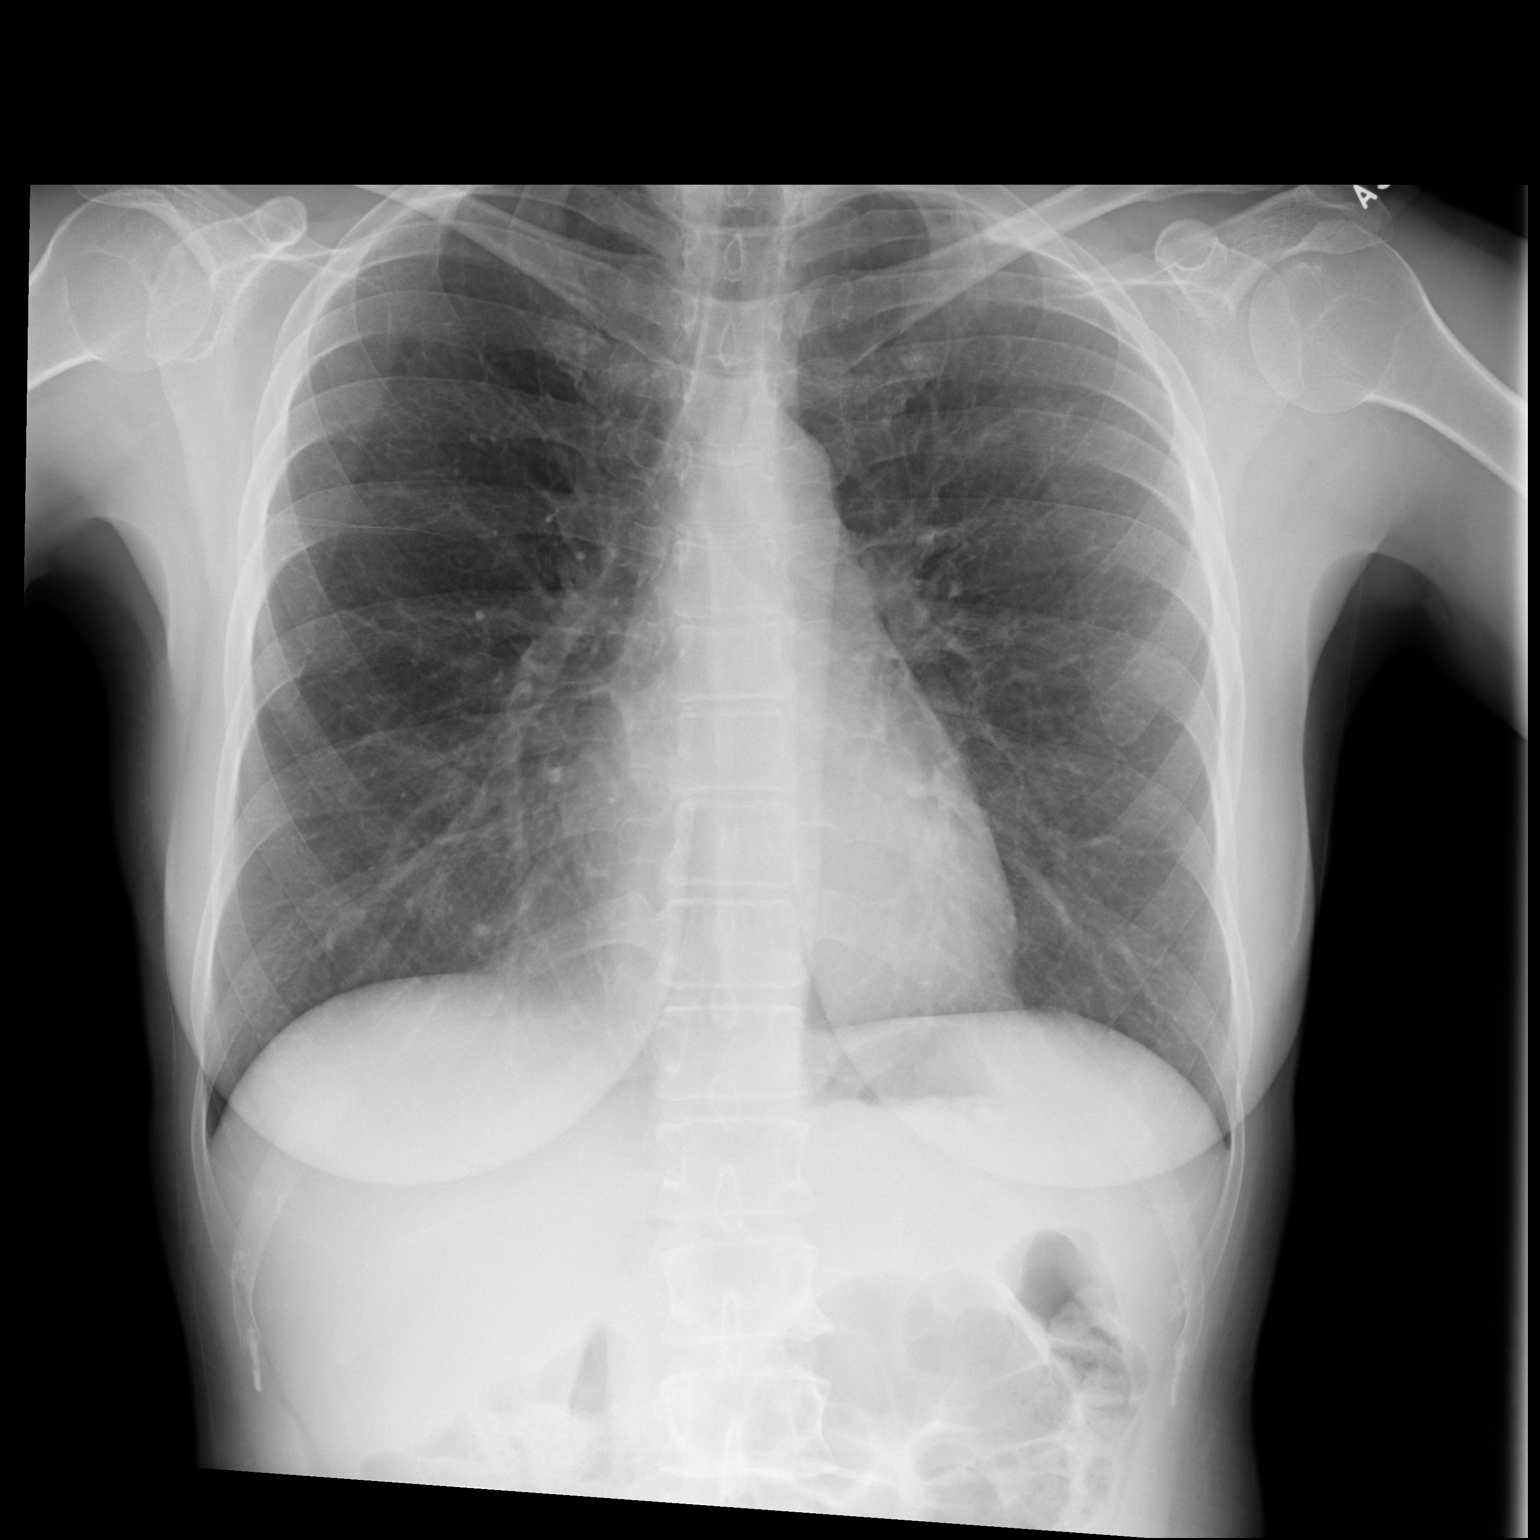

[dg chest 2 view (2 of 2)]
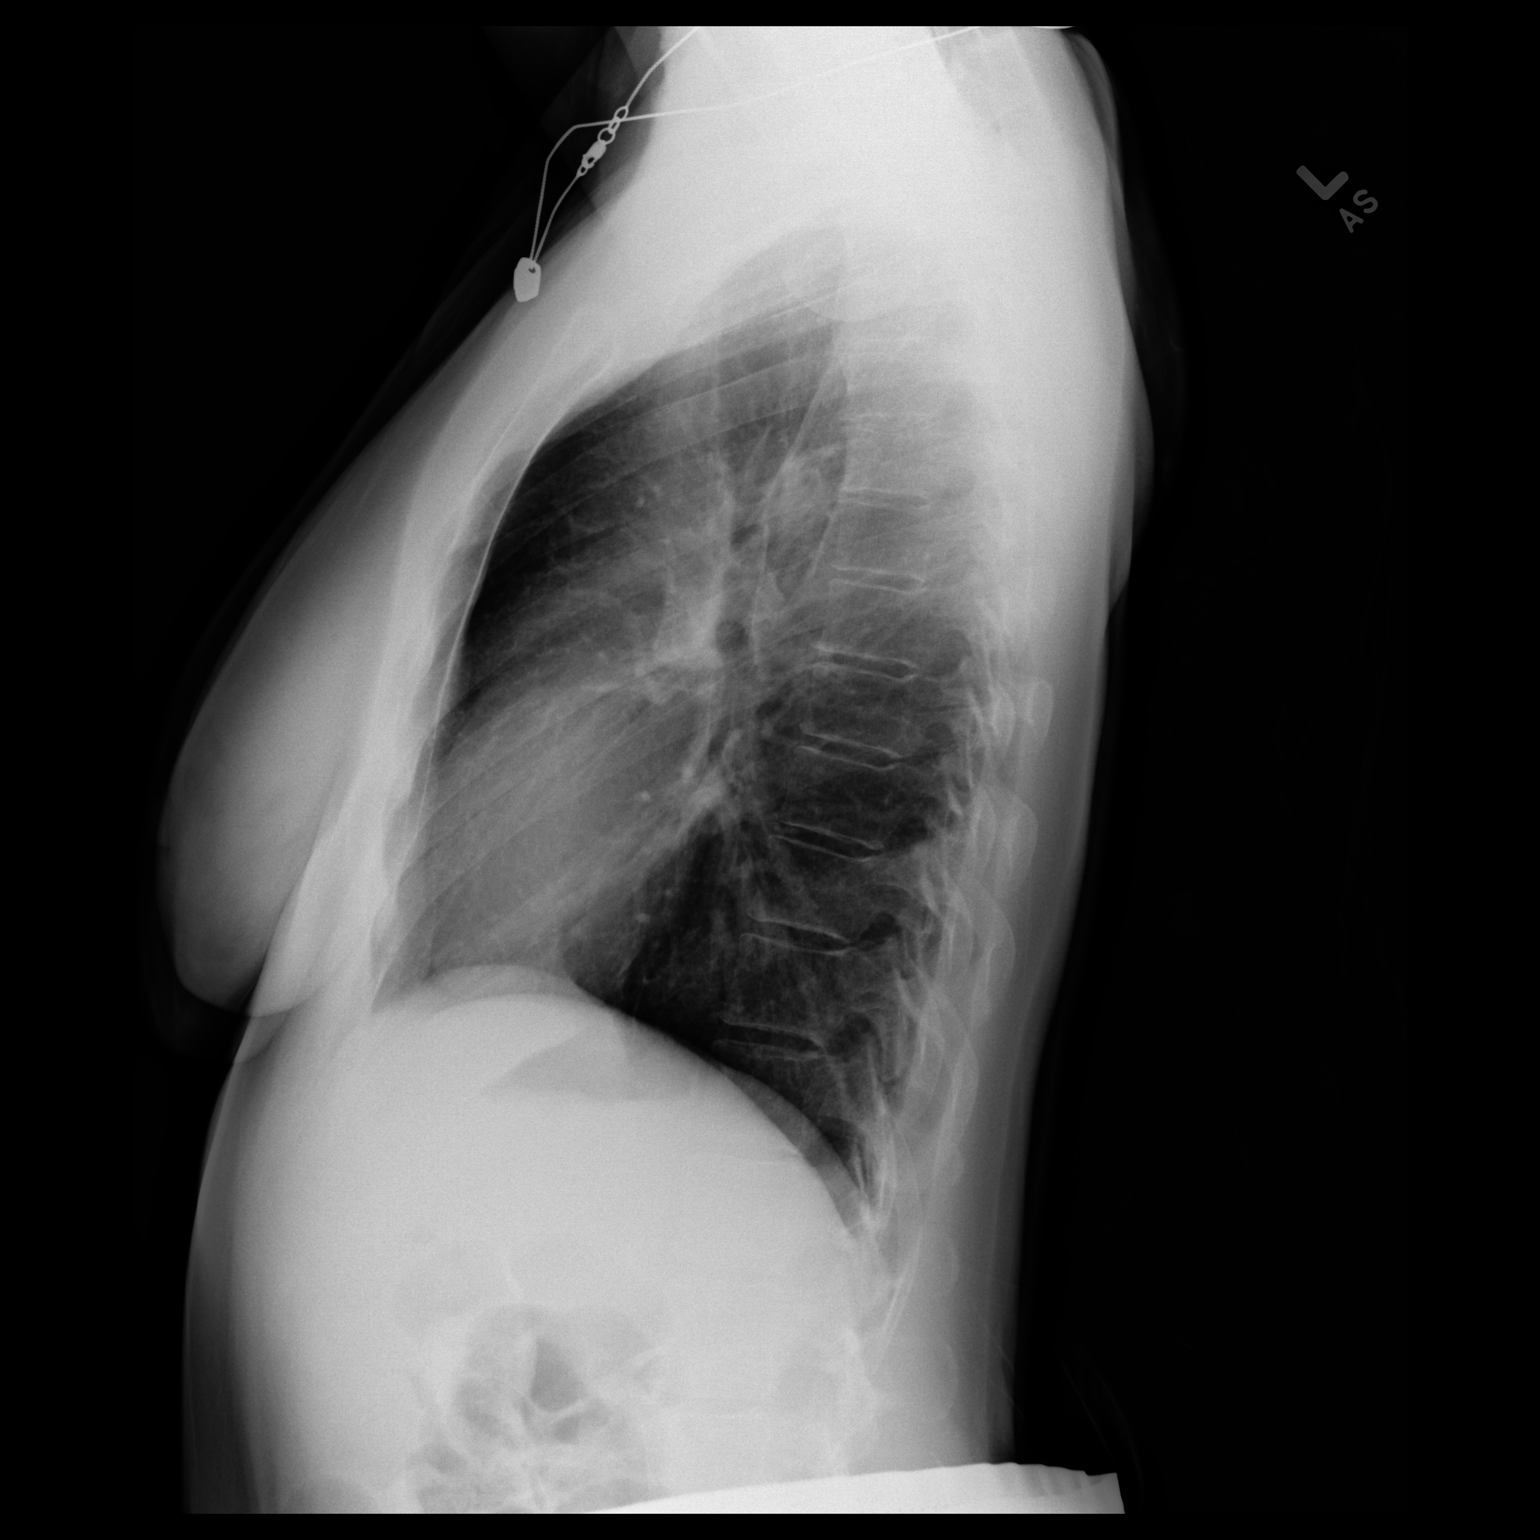

[2 of 2 positions shown; findings below may reference images not displayed]

FINDINGS: The heart size and mediastinal contours are within normal limits.
Both lungs are clear. The visualized skeletal structures are
unremarkable.
IMPRESSION: No active cardiopulmonary disease.
